# Patient Record
Sex: Female | Born: 1945 | Race: White | Hispanic: No | Smoking: Never smoker
Health system: Southern US, Community
[De-identification: ages and names within clinical notes are randomized; demographics above are authoritative.]

## PROBLEM LIST (undated history)

## (undated) DIAGNOSIS — F0280 Dementia in other diseases classified elsewhere without behavioral disturbance: Secondary | ICD-10-CM

## (undated) DIAGNOSIS — G309 Alzheimer's disease, unspecified: Secondary | ICD-10-CM

---

## 2014-08-23 ENCOUNTER — Ambulatory Visit (INDEPENDENT_AMBULATORY_CARE_PROVIDER_SITE_OTHER): Payer: Medicare Other

## 2014-08-23 ENCOUNTER — Other Ambulatory Visit: Payer: Self-pay | Admitting: Family Medicine

## 2014-08-23 DIAGNOSIS — X58XXXA Exposure to other specified factors, initial encounter: Secondary | ICD-10-CM | POA: Diagnosis not present

## 2014-08-23 DIAGNOSIS — S92315A Nondisplaced fracture of first metatarsal bone, left foot, initial encounter for closed fracture: Secondary | ICD-10-CM

## 2014-08-23 DIAGNOSIS — S99922A Unspecified injury of left foot, initial encounter: Secondary | ICD-10-CM

## 2014-08-24 ENCOUNTER — Encounter: Payer: Self-pay | Admitting: Sports Medicine

## 2014-08-24 ENCOUNTER — Ambulatory Visit (INDEPENDENT_AMBULATORY_CARE_PROVIDER_SITE_OTHER): Payer: Medicare Other | Admitting: Sports Medicine

## 2014-08-24 VITALS — BP 123/80 | HR 66 | Wt 105.0 lb

## 2014-08-24 DIAGNOSIS — M419 Scoliosis, unspecified: Secondary | ICD-10-CM | POA: Diagnosis not present

## 2014-08-24 DIAGNOSIS — S92312A Displaced fracture of first metatarsal bone, left foot, initial encounter for closed fracture: Secondary | ICD-10-CM

## 2014-08-24 NOTE — Progress Notes (Signed)
   Subjective:    I'm seeing this patient as a consultation for:  Dr. Harl Bowie  CC: foot fracture  HPI: This is a  69 year old female with end-stage dementia, she is nonverbal, husband was trying to help her, she started to fall and he took a misstep and accidentally stepped on her foot, she into the fracture, she was referred to me for further evaluation and definitive treatment.  Also has severe kyphoscoliosis, as well as a thoracolumbar brace which is ineffective. Her husband has done a good job putting on a soft collar, but unfortunately she continues to sit hunched forward all day.  Past medical history, Surgical history, Family history not pertinant except as noted below, Social history, Allergies, and medications have been entered into the medical record, reviewed, and no changes needed.   Review of Systems: No headache, visual changes, nausea, vomiting, diarrhea, constipation, dizziness, abdominal pain, skin rash, fevers, chills, night sweats, weight loss, swollen lymph nodes, body aches, joint swelling, muscle aches, chest pain, shortness of breath, mood changes, visual or auditory hallucinations.   Objective:   General: Well Developed, well nourished, and in no acute distress.  Neuro/Psych: responds to tactile stimuli and pain, looks around, nonverbal, minimally ambulatory, patient sits hunched forward with kyphoscoliosis. Skin: Warm and dry, no rashes noted.  Respiratory: Not using accessory muscles, speaking in full sentences, trachea midline.  Cardiovascular: Pulses palpable, no extremity edema. Abdomen: Does not appear distended. Left foot: Swollen, bruised, tender to palpation over the first metatarsal  Foot was strapped with compressive dressing.  X-rays reviewed and show a nondisplaced fracture through the first metatarsal  Impression and Recommendations:   This case required medical decision making of moderate complexity.

## 2014-08-24 NOTE — Assessment & Plan Note (Signed)
Strap with compressive dressing. Postop shoe. Return in 3 weeks. Patient is nonverbal, we will do our best to try to ascertain when she is in pain.  I billed a fracture code for this encounter, all subsequent visits will be post-op checks in the global period.

## 2014-08-24 NOTE — Assessment & Plan Note (Addendum)
Current thoracolumbar braces insufficient at reducing her kyphoscoliosis. She also has significant flexion deformity of the cervical spine. She does need the Don[employer] duel TLSO, small. She can also continue to wear the soft cervical collar.

## 2014-09-13 ENCOUNTER — Ambulatory Visit: Payer: No Typology Code available for payment source | Admitting: Sports Medicine

## 2014-09-13 ENCOUNTER — Ambulatory Visit (INDEPENDENT_AMBULATORY_CARE_PROVIDER_SITE_OTHER): Payer: Medicare Other | Admitting: Sports Medicine

## 2014-09-13 ENCOUNTER — Ambulatory Visit (INDEPENDENT_AMBULATORY_CARE_PROVIDER_SITE_OTHER): Payer: Medicare Other

## 2014-09-13 VITALS — BP 133/85 | HR 95

## 2014-09-13 DIAGNOSIS — M419 Scoliosis, unspecified: Secondary | ICD-10-CM

## 2014-09-13 DIAGNOSIS — S92312D Displaced fracture of first metatarsal bone, left foot, subsequent encounter for fracture with routine healing: Secondary | ICD-10-CM

## 2014-09-13 DIAGNOSIS — S92315D Nondisplaced fracture of first metatarsal bone, left foot, subsequent encounter for fracture with routine healing: Secondary | ICD-10-CM | POA: Diagnosis not present

## 2014-09-13 DIAGNOSIS — S92312A Displaced fracture of first metatarsal bone, left foot, initial encounter for closed fracture: Secondary | ICD-10-CM

## 2014-09-13 DIAGNOSIS — X58XXXD Exposure to other specified factors, subsequent encounter: Secondary | ICD-10-CM

## 2014-09-13 NOTE — Assessment & Plan Note (Signed)
Significant improvement in severe kyphosis with the new thoracolumbosacral orthosis. She may continue to wear the soft collar as needed. The patient's husband will need to modify the straps to apply further traction on her chest.

## 2014-09-13 NOTE — Progress Notes (Signed)
  Subjective: 3 weeks post fracture of the left fifth metatarsal shaft, doing well.   Objective: General: Well-developed, well-nourished, and in no acute distress. Left foot: Still swollen but no longer exquisitely tender over the fracture site, it is however difficult to judge pain as the patient is nonverbal.  X-rays reviewed and I can no longer see the fracture line.  The foot was strapped with compressive dressing.  Assessment/plan:

## 2014-09-13 NOTE — Assessment & Plan Note (Signed)
3 weeks post fracture, doing well. Return in 3 weeks, no x-ray needed.

## 2014-10-04 ENCOUNTER — Ambulatory Visit: Payer: No Typology Code available for payment source | Admitting: Sports Medicine

## 2014-10-05 ENCOUNTER — Encounter: Payer: Self-pay | Admitting: Sports Medicine

## 2014-10-05 ENCOUNTER — Ambulatory Visit: Payer: Medicare Other | Admitting: Sports Medicine

## 2014-10-05 VITALS — BP 138/72 | HR 59

## 2014-10-05 DIAGNOSIS — F0390 Unspecified dementia without behavioral disturbance: Secondary | ICD-10-CM | POA: Insufficient documentation

## 2014-10-05 DIAGNOSIS — G40909 Epilepsy, unspecified, not intractable, without status epilepticus: Secondary | ICD-10-CM | POA: Insufficient documentation

## 2014-10-05 DIAGNOSIS — S92312D Displaced fracture of first metatarsal bone, left foot, subsequent encounter for fracture with routine healing: Secondary | ICD-10-CM

## 2014-10-05 NOTE — Assessment & Plan Note (Signed)
With contractures and spasticity, needs some home health physical therapy.

## 2014-10-05 NOTE — Progress Notes (Signed)
  Subjective:  6 weeks post fracture of the left fifth metatarsal, doing extremely well.  Objective: General: Well-developed, well-nourished, and in no acute distress. Right Foot: No visible erythema or swelling. Range of motion is full in all directions. Strength is 5/5 in all directions. No hallux valgus. No pes cavus or pes planus. No abnormal callus noted. No pain over the navicular prominence, or base of fifth metatarsal. No tenderness to palpation of the calcaneal insertion of plantar fascia. No pain at the Achilles insertion. No pain over the calcaneal bursa. No pain of the retrocalcaneal bursa. No tenderness to palpation over the tarsals, metatarsals, or phalanges. No hallux rigidus or limitus. No tenderness palpation over interphalangeal joints. No pain with compression of the metatarsal heads. Neurovascularly intact distally. Overall she does have increased tone and contractures post onset of dementia and epilepsy.  Assessment/plan:

## 2014-10-05 NOTE — Assessment & Plan Note (Signed)
Clinically resolved.  

## 2014-10-08 ENCOUNTER — Telehealth: Payer: Self-pay

## 2014-10-08 NOTE — Telephone Encounter (Signed)
Have him go ahead and try again.

## 2014-10-08 NOTE — Telephone Encounter (Signed)
June from [facility] called stated that there is a delay is getting the patient started for PT because he was unable to get in touch with the patient. He stated that he has called as well as drove by the patient home and no one came to the door. Please be advised. Rhonda Cunningham,CMA

## 2014-10-11 NOTE — Telephone Encounter (Signed)
SPOKE TO June HE STATED THAT HE WILL RECHECK. Rhonda Cunningham,CMA

## 2014-10-18 ENCOUNTER — Telehealth: Payer: Self-pay | Admitting: Sports Medicine

## 2014-10-18 NOTE — Telephone Encounter (Signed)
C[facility] from [facility] [phone]) called to inform Physician of the following:  1. Due to dementia, there will be no PT based on Pt's inability to follow commands. 2. Pt's husband declined home nursing stating "theres nothing they would be able to do."

## 2015-04-13 ENCOUNTER — Encounter: Payer: Self-pay | Admitting: *Deleted

## 2015-04-13 ENCOUNTER — Emergency Department (INDEPENDENT_AMBULATORY_CARE_PROVIDER_SITE_OTHER)
Admission: EM | Admit: 2015-04-13 | Discharge: 2015-04-13 | Disposition: A | Discharge status: Home / Self Care | Payer: Medicare Other | Source: Home / Self Care | Attending: Emergency Medicine | Admitting: Emergency Medicine

## 2015-04-13 DIAGNOSIS — S0101XA Laceration without foreign body of scalp, initial encounter: Secondary | ICD-10-CM

## 2015-04-13 HISTORY — DX: Alzheimer's disease, unspecified: G30.9

## 2015-04-13 HISTORY — DX: Dementia in other diseases classified elsewhere, unspecified severity, without behavioral disturbance, psychotic disturbance, mood disturbance, and anxiety: F02.80

## 2015-04-13 NOTE — ED Notes (Signed)
Pt's husband reports that she fell off the commode 1 1/2 hr ago causing a laceration to the top of her head. No LOC. Tdap 2 years ago.

## 2015-04-13 NOTE — ED Provider Notes (Signed)
CSN: [id number]     Arrival date & time 04/13/15  1203 History   First MD Initiated Contact with Patient 04/13/15 1227     Chief Complaint  Patient presents with  . Head Laceration   (Consider location/radiation/quality/duration/timing/severity/associated sxs/prior Treatment) Patient is a 70 y.o. female presenting with scalp laceration. The history is provided by the patient. No language interpreter was used.  Head Laceration This is a new problem. The current episode started 1 to 2 hours ago. The problem has not changed since onset.Pertinent negatives include no headaches. Nothing aggravates the symptoms. Nothing relieves the symptoms. She has tried nothing for the symptoms. The treatment provided no relief.  Pt has severe alzheimers.  Pt is nonverbal and minimally responsive.  Pt's family reports he had patient on the toliet and she slid.  Pt scrapped head on corner of counter.  He reports no blow.  He does not think she had enough impact to have any type of head injury. He reports pt is at baseline.  Past Medical History  Diagnosis Date  . Alzheimer disease    History reviewed. No pertinent past surgical history. History reviewed. No pertinent family history. Social History  Substance Use Topics  . Smoking status: Never Smoker   . Smokeless tobacco: None  . Alcohol Use: None   OB History    No data available     Review of Systems  Neurological: Negative for headaches.  All other systems reviewed and are negative.   Allergies  Review of patient's allergies indicates no known allergies.  Home Medications   Prior to Admission medications   Medication Sig Start Date End Date Taking? Authorizing Provider  levETIRAcetam (KEPPRA) 100 MG/ML solution Take by mouth 2 (two) times daily.   Yes Historical Provider, MD  Valproic Acid LIQD by Does not apply route.   Yes Historical Provider, MD  [facility] 28-10 MG CP24  06/23/14   Historical Provider, MD  TRINTELLIX 10 MG TABS  08/10/14    Historical Provider, MD   Meds Ordered and Administered this Visit  Medications - No data to display  BP 118/73 mmHg  Pulse 83  Temp(Src) 96.8 F (36 C) (Tympanic)  Resp 14  SpO2 100% No data found.   Physical Exam  Constitutional: She is oriented to person, place, and time. She appears well-developed and well-nourished.  HENT:  Head: Normocephalic.  Left Ear: External ear normal.  Nose: Nose normal.  2.5 cm laceration mid scalp  Eyes: Pupils are equal, round, and reactive to light.  Neck: Normal range of motion.  Pulmonary/Chest: Effort normal.  Abdominal: She exhibits no distension.  Musculoskeletal: Normal range of motion.  Neurological: She is alert and oriented to person, place, and time.  Skin: Skin is warm.  Psychiatric: She has a normal mood and affect.  Nursing note and vitals reviewed.   ED Course  .Marland KitchenLaceration Repair Date/Time: 04/13/2015 12:36 PM Performed by: Elson Areas Authorized by: Georgina Pillion, DAVID Consent: Verbal consent obtained. Risks and benefits: risks, benefits and alternatives were discussed Consent given by: spouse and guardian Patient identity confirmed: verbally with patient Body area: head/neck Location details: scalp Laceration length: 2.5 cm Foreign bodies: no foreign bodies Tendon involvement: none Vascular damage: yes Anesthesia: local infiltration Local anesthetic: lidocaine 2% without epinephrine Patient sedated: no Preparation: Patient was prepped and draped in the usual sterile fashion. Irrigation solution: saline Amount of cleaning: standard Debridement: none Degree of undermining: none Skin closure: staples Number of sutures: 5 Technique: simple Approximation: loose  Approximation difficulty: simple Patient tolerance: Patient tolerated the procedure well with no immediate complications   (including critical care time)  Labs Review Labs Reviewed - No data to display  Imaging Review No results found.   Visual  Acuity Review  Right Eye Distance:   Left Eye Distance:   Bilateral Distance:    Right Eye Near:   Left Eye Near:    Bilateral Near:         MDM Pt is not on any blood thinners.  Family does not feel pt has brain injury,  Caregiver not interested Ct evaluation.  I counseled of signs and symptoms taht would merit further evaluation   1. Laceration of scalp, initial encounter    An After Visit Summary was printed and given to the patient.    Lonia SkinnerLeslie K Plantation IslandSofia, PA-C 04/13/15 1244

## 2015-04-13 NOTE — Discharge Instructions (Signed)

## 2015-04-14 ENCOUNTER — Telehealth: Payer: Self-pay | Admitting: Emergency Medicine

## 2015-06-02 DEATH — deceased

## 2015-12-15 IMAGING — CR DG FOOT COMPLETE 3+V*L*
3 series · 3 of 3 positions shown · non-contrast
Comparison: August 23, 2014

CLINICAL DATA: Prior first metatarsal fracture. Assess healing
response

EXAM:
LEFT FOOT - COMPLETE 3+ VIEW

[foot ap]
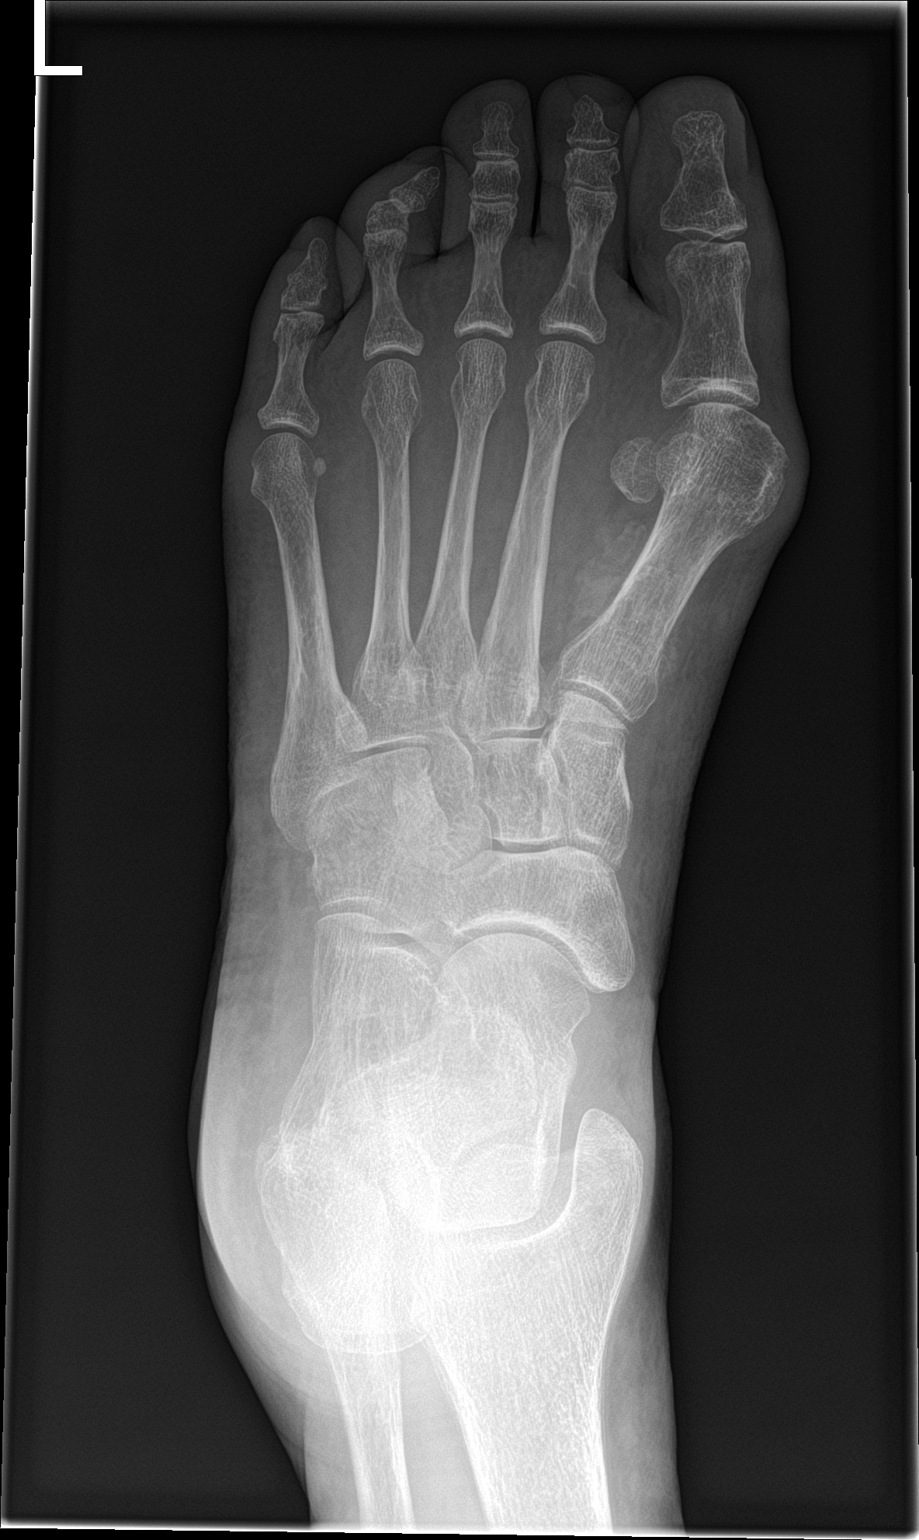

[foot obl]
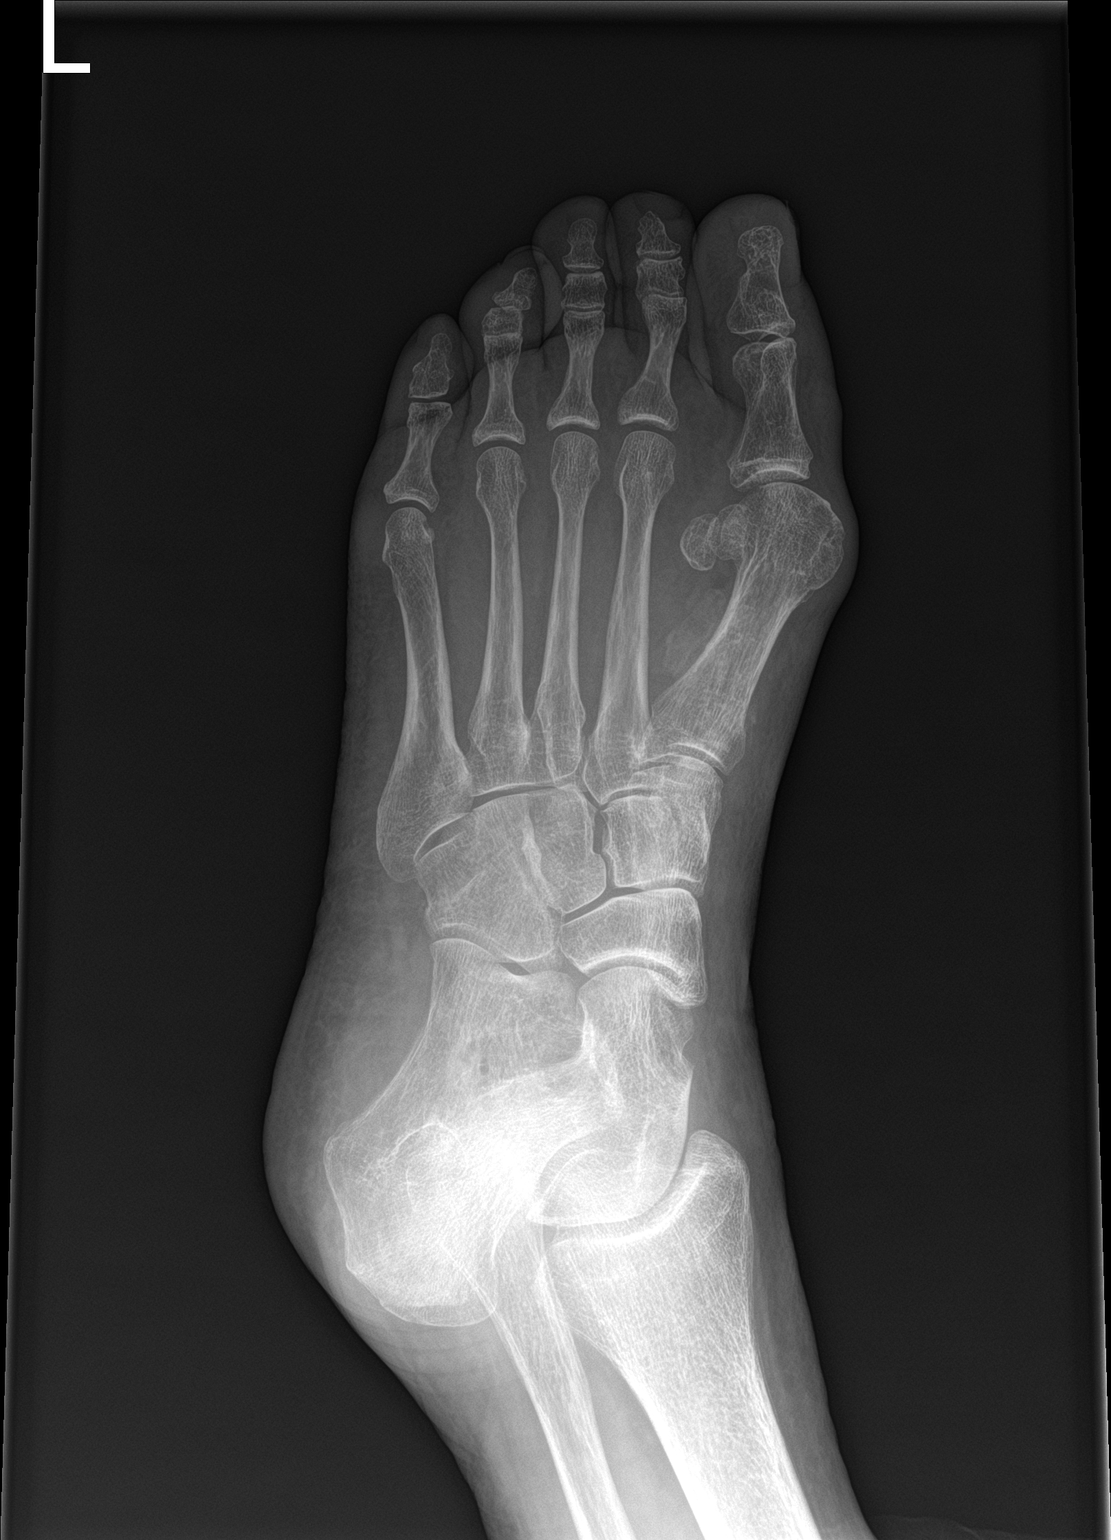

[foot lat]
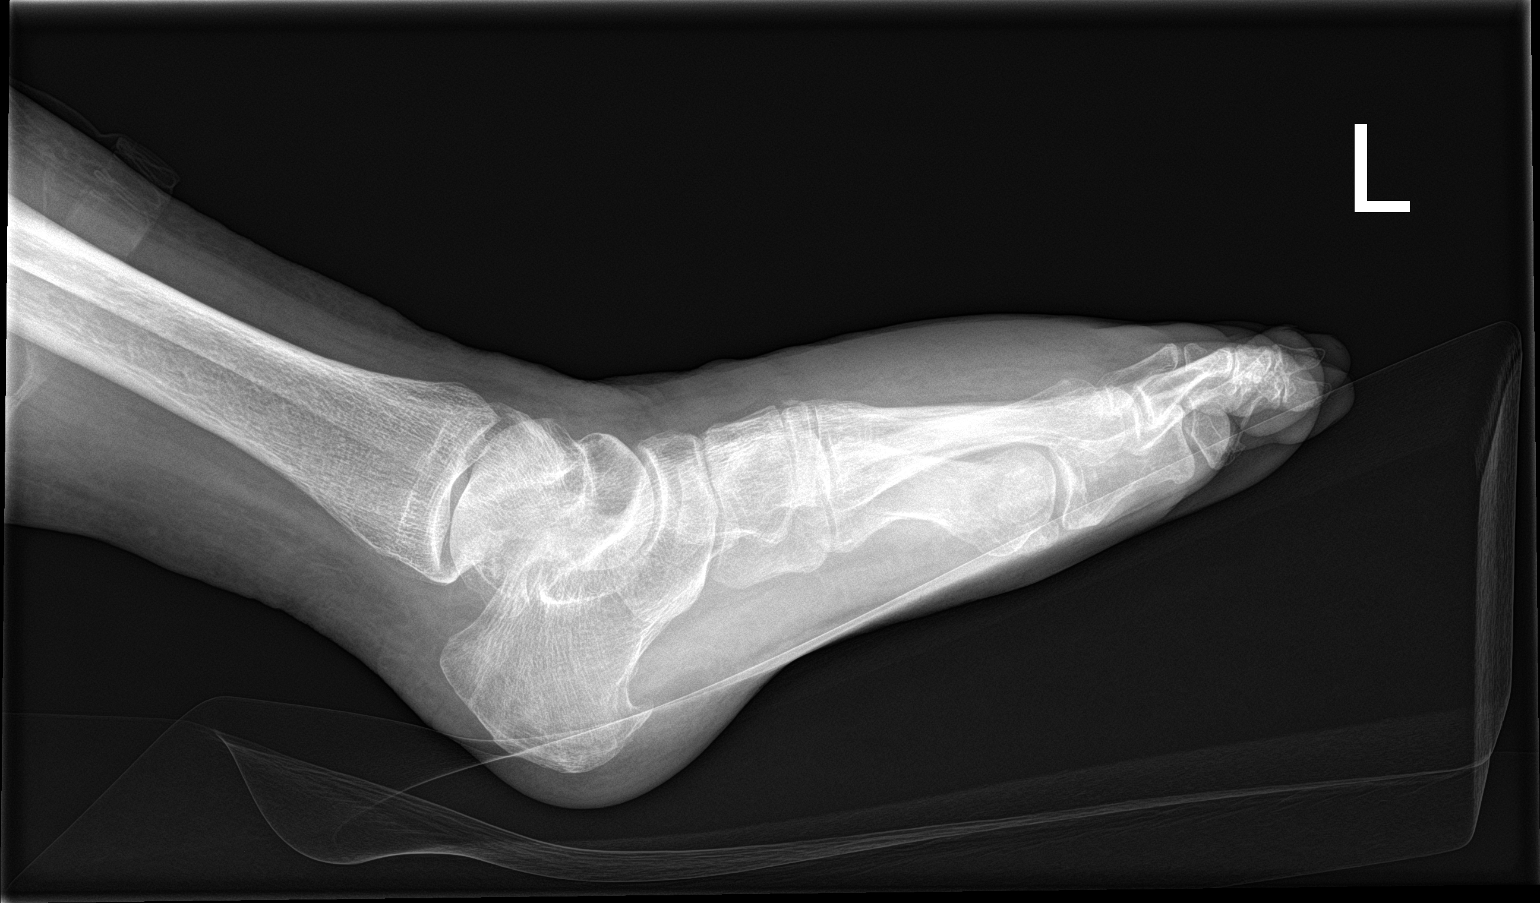

[3 of 3 positions shown; findings below may reference images not displayed]

FINDINGS: Frontal, oblique, and lateral views obtained. There is callus
formation surrounding the nondisplaced fracture of the first
metatarsal. Alignment is anatomic at the fracture site. No new
fracture. No dislocation. There is diffuse soft tissue swelling
dorsally. There is hallux valgus deformity at the first MTP joint
with mild narrowing of the first MTP joint. There is mild narrowing
of all PIP and DIP joints. No erosive change. There is a small
inferior calcaneal spur.
IMPRESSION: Callus formation consistent with healing response at the mid portion
first metatarsal fracture site. Alignment anatomic. No new fracture.
No dislocation. There is arthropathy in several distal joints.
Hallux valgus deformity is noted at the first MTP joint. Marked
swelling dorsally again noted.
